# Patient Record
Sex: Male | Born: 1945 | Race: Black or African American | Hispanic: No | Marital: Single | Smoking: Current every day smoker
Health system: Southern US, Community
[De-identification: ages and names within clinical notes are randomized; demographics above are authoritative.]

## PROBLEM LIST (undated history)

## (undated) ENCOUNTER — Ambulatory Visit (HOSPITAL_COMMUNITY): Admission: EM | Payer: Self-pay | Source: Home / Self Care

---

## 2020-03-06 ENCOUNTER — Encounter (HOSPITAL_COMMUNITY): Payer: Self-pay | Admitting: Emergency Medicine

## 2020-03-06 ENCOUNTER — Other Ambulatory Visit: Payer: Self-pay

## 2020-03-06 ENCOUNTER — Emergency Department (HOSPITAL_COMMUNITY): Payer: Self-pay

## 2020-03-06 ENCOUNTER — Emergency Department (HOSPITAL_COMMUNITY)
Admission: EM | Admit: 2020-03-06 | Discharge: 2020-03-06 | Disposition: A | Discharge status: Home / Self Care | Payer: Self-pay | Attending: Emergency Medicine | Admitting: Emergency Medicine

## 2020-03-06 DIAGNOSIS — I10 Essential (primary) hypertension: Secondary | ICD-10-CM | POA: Insufficient documentation

## 2020-03-06 DIAGNOSIS — R42 Dizziness and giddiness: Secondary | ICD-10-CM

## 2020-03-06 LAB — URINALYSIS, ROUTINE W REFLEX MICROSCOPIC
Bacteria, UA: NONE SEEN
Bilirubin Urine: NEGATIVE
Glucose, UA: NEGATIVE mg/dL
Ketones, ur: NEGATIVE mg/dL
Leukocytes,Ua: NEGATIVE
Nitrite: NEGATIVE
Protein, ur: NEGATIVE mg/dL
Specific Gravity, Urine: 1.026 (ref 1.005–1.030)
pH: 5 (ref 5.0–8.0)

## 2020-03-06 LAB — BASIC METABOLIC PANEL
Anion gap: 10 (ref 5–15)
BUN: 10 mg/dL (ref 8–23)
CO2: 27 mmol/L (ref 22–32)
Calcium: 8.6 mg/dL — ABNORMAL LOW (ref 8.9–10.3)
Chloride: 99 mmol/L (ref 98–111)
Creatinine, Ser: 0.95 mg/dL (ref 0.61–1.24)
GFR calc Af Amer: >60 mL/min (ref >60)
GFR calc non Af Amer: >60 mL/min (ref >60)
Glucose, Bld: 100 mg/dL — ABNORMAL HIGH (ref 70–99)
Potassium: 4.2 mmol/L (ref 3.5–5.1)
Sodium: 136 mmol/L (ref 135–145)

## 2020-03-06 LAB — CBC
HCT: 45.2 % (ref 39.0–52.0)
Hemoglobin: 14 g/dL (ref 13.0–17.0)
MCH: 27.6 pg (ref 26.0–34.0)
MCHC: 31 g/dL (ref 30.0–36.0)
MCV: 89.2 fL (ref 80.0–100.0)
Platelets: 292 10*3/uL (ref 150–400)
RBC: 5.07 MIL/uL (ref 4.22–5.81)
RDW: 14.4 % (ref 11.5–15.5)
WBC: 6.6 10*3/uL (ref 4.0–10.5)
nRBC: 0 % (ref 0.0–0.2)

## 2020-03-06 MED ORDER — HYDROCHLOROTHIAZIDE 25 MG PO TABS: 25.0000 mg | ORAL_TABLET | Freq: Every day | ORAL | 0 refills | Status: DC

## 2020-03-06 MED ORDER — SODIUM CHLORIDE 0.9% FLUSH: 3.0000 mL | Freq: Once | INTRAVENOUS | Status: DC

## 2020-03-06 NOTE — ED Provider Notes (Signed)
Emergency Department Provider Note   I have reviewed the triage vital signs and the nursing notes.   HISTORY  Chief Complaint Dizziness   HPI Hudsen Fei is a 74 y.o. male with no known past medical history with lack of primary care doctor, presents to the emergency department with episode of dizziness approximately 1 week ago.  He states that while raking leaves he "felt drunk" and off balance for around 30 minutes.  He denies any associated chest pain, heart palpitations, shortness of breath.  No ear ringing or pain.  He denies any weakness or numbness.  He did not fall to the ground.  His symptoms resolved and have not returned since.  He states he presents today because he has been speaking with his mom who advised that he come up and be seen regarding this.  He does not have a primary care doctor and does not take any home medications.  He has no known past medical history.  He has not had return of this off balance/drunk feeling.   History reviewed. No pertinent past medical history.  There are no problems to display for this patient.   History reviewed. No pertinent surgical history.  Allergies Patient has no known allergies.  No family history on file.  Social History Social History   Tobacco Use  . Smoking status: Never Smoker  . Smokeless tobacco: Never Used  Substance Use Topics  . Alcohol use: Not Currently  . Drug use: Not Currently    Review of Systems  Constitutional: No fever/chills. 30 min episode of off balance feeling 1 week prior.  Eyes: No visual changes. ENT: No sore throat. Cardiovascular: Denies chest pain. Respiratory: Denies shortness of breath. Gastrointestinal: No abdominal pain.  No nausea, no vomiting.  No diarrhea.  No constipation. Genitourinary: Negative for dysuria. Musculoskeletal: Negative for back pain. Skin: Negative for rash. Neurological: Negative for headaches, focal weakness or numbness.   10-point ROS otherwise  negative.  ____________________________________________   PHYSICAL EXAM:  VITAL SIGNS: ED Triage Vitals  Enc Vitals Group     BP 03/06/20 1619 (!) 207/104     Pulse Rate 03/06/20 1619 77     Resp 03/06/20 1619 16     Temp 03/06/20 1619 98.3 F (36.8 C)     Temp Source 03/06/20 1619 Oral     SpO2 03/06/20 1619 99 %     Weight 03/06/20 1621 130 lb (59 kg)     Height 03/06/20 1621 5\' 7"  (1.702 m)   Constitutional: Alert and oriented. Well appearing and in no acute distress. Eyes: Conjunctivae are normal. PERRL. EOMI. Head: Atraumatic. Nose: No congestion/rhinnorhea. Mouth/Throat: Mucous membranes are moist. Neck: No stridor.  Cardiovascular: Normal rate, regular rhythm. Good peripheral circulation. Grossly normal heart sounds.   Respiratory: Normal respiratory effort.  No retractions. Lungs CTAB. Gastrointestinal: Soft and nontender. No distention.  Musculoskeletal: No lower extremity tenderness nor edema. No gross deformities of extremities. Neurologic:  Normal speech and language. No gross focal neurologic deficits are appreciated. Normal CN exam 2-12. Normal finger-to-nose testing. Normal gait.  Skin:  Skin is warm, dry and intact. No rash noted.   ____________________________________________   LABS (all labs ordered are listed, but only abnormal results are displayed)  Labs Reviewed  BASIC METABOLIC PANEL - Abnormal; Notable for the following components:      Result Value   Glucose, Bld 100 (*)    Calcium 8.6 (*)    All other components within normal limits  URINALYSIS,  ROUTINE W REFLEX MICROSCOPIC - Abnormal; Notable for the following components:   Hgb urine dipstick SMALL (*)    All other components within normal limits  CBC  CBG MONITORING, ED   ____________________________________________  EKG   EKG Interpretation  Date/Time:  Thursday March 06 2020 16:40:37 EDT Ventricular Rate:  74 PR Interval:  160 QRS Duration: 70 QT Interval:  396 QTC  Calculation: 439 R Axis:   12 Text Interpretation: Normal sinus rhythm Normal ECG No STEMI Confirmed by Alona Bene [phone]) on 03/06/2020 6:08:55 PM       ____________________________________________  RADIOLOGY  MR BRAIN WO CONTRAST  Result Date: 03/06/2020 CLINICAL DATA:  Dizziness EXAM: MRI HEAD WITHOUT CONTRAST TECHNIQUE: Multiplanar, multiecho pulse sequences of the brain and surrounding structures were obtained without intravenous contrast. COMPARISON:  None. FINDINGS: Brain: There is no acute infarct, acute hemorrhage or extra-axial collection. Early confluent hyperintense T2-weighted signal of the periventricular and deep white matter, most commonly due to chronic ischemic microangiopathy. 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 There is mild generalized volume loss. Bilateral chronic microhemorrhage in the cerebellum. Normal midline structures. Vascular: Normal flow voids Skull and upper cervical spine: Normal marrow signal Sinuses/Orbits: Normal Other: None IMPRESSION: 1. No acute intracranial abnormality. 2. 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 Electronically Signed   By: Deatra Robinson M.D.   On: 03/06/2020 20:08   DG Chest Portable 1 View  Result Date: 03/06/2020 CLINICAL DATA:  Near syncope EXAM: PORTABLE CHEST 1 VIEW COMPARISON:  None. FINDINGS: The cardiac silhouette is mildly enlarged. Aortic calcifications are noted. There is no pneumothorax. No large pleural effusion. No focal infiltrate. There is a rounded density measuring approximately 8 mm projecting over the right lower lung zone at the level of the posterior ninth rib. There is no acute osseous abnormality. IMPRESSION: 1. No acute cardiopulmonary findings. 2. A 8 mm rounded density projecting over the right lower lung zone. This may represent a nipple shadow. A follow-up 4-6 week two-view chest x-ray with nipple markers is recommended for  further evaluation of this finding. Electronically Signed   By: Katherine Mantle M.D.   On: 03/06/2020 18:43    ____________________________________________   PROCEDURES  Procedure(s) performed:   Procedures  None  ____________________________________________   INITIAL IMPRESSION / ASSESSMENT AND PLAN / ED COURSE  Pertinent labs & imaging results that were available during my care of the patient were reviewed by me and considered in my medical decision making (see chart for details).   Patient presents to the emergency department with episode of "feeling drunk" for about 30 minutes 1 week prior.  No chest pain.  EKG shows no acute ischemic changes or arrhythmias.  Patient's initial blood work is largely unremarkable.  He is not having any active symptoms or neuro deficits.  His presenting blood pressure is very high at 207/104.  Unknown past medical history to help further risk stratify.  He does give a description of a process that is possibly central.  Plan for MRI brain along with chest x-ray and follow-up after additional blood work.  Will attempt to facilitate PCP follow up as well.   Labs and imaging reviewed.  I do not find evidence of stroke on MRI.  Patient does have significantly elevated blood pressure but no evidence on exam or history to suspect acute hypertensive emergency.  I will start HCTZ.  I have given him contact information for primary care doctor  and encouraged him to call first thing tomorrow morning to schedule the next available appointment.  Also discussed ED return precautions in detail.  ____________________________________________  FINAL CLINICAL IMPRESSION(S) / ED DIAGNOSES  Final diagnoses:  Dizziness  Essential hypertension     MEDICATIONS GIVEN DURING THIS VISIT:  Medications  sodium chloride flush (NS) 0.9 % injection 3 mL (has no administration in time range)     NEW OUTPATIENT MEDICATIONS STARTED DURING THIS VISIT:  Discharge Medication  List as of 03/06/2020  8:26 PM    START taking these medications   Details  hydrochlorothiazide (HYDRODIURIL) 25 MG tablet Take 1 tablet (25 mg total) by mouth daily., Starting Thu 03/06/2020, Until Sat 04/05/2020, Print        Note:  This document was prepared using Dragon voice recognition software and may include unintentional dictation errors.  Nanda Quinton, MD, [facility] Emergency Medicine    Taelor Waymire, [facility], MD 03/06/20 2252

## 2020-03-06 NOTE — ED Triage Notes (Signed)
Pt states he has been feeling dizzy for 3 days. Denies any weakness, CP, SOB. Grips equal, speech clear. denies any difficulty walking,.  Denies any medical problems. BP 207/104 in triage.

## 2020-03-06 NOTE — ED Notes (Signed)
Patient transported to MRI 

## 2020-03-06 NOTE — ED Notes (Signed)
Signature pad not available for pt. Pt verbalized discharge paperwork and understanding.

## 2020-03-06 NOTE — ED Notes (Signed)
Nani Gasser, to be notified when pt in room at [phone].

## 2020-03-06 NOTE — Discharge Instructions (Signed)
You were seen in the emergency department today with dizziness.  Your work-up today was normal.  You do have elevated blood pressure and need to follow closely with the primary care doctor.  I am starting you on some blood pressure medication here today.  Please take this as prescribed but call the primary care doctor first thing tomorrow morning schedule the next follow-up appointment.

## 2021-05-03 IMAGING — MR MR HEAD W/O CM
12 of 13 series · 44 of 48 positions shown · non-contrast
Comparison: None.

CLINICAL DATA: Dizziness

EXAM:
MRI HEAD WITHOUT CONTRAST
TECHNIQUE: Multiplanar, multiecho pulse sequences of the brain and surrounding
structures were obtained without intravenous contrast.

[Series 5: DWI · axial · 3.0mm · 0.88mm/px · z∈[-78,+80]mm · 8 of 108 slices shown (1 of 4)]
[im 1/108]
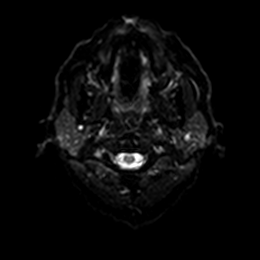
[im 16/108]
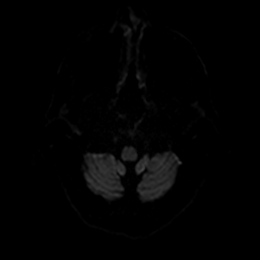
[im 31/108]
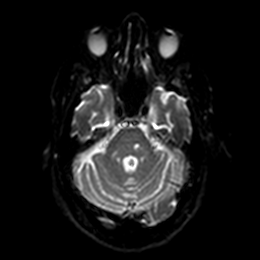
[im 46/108]
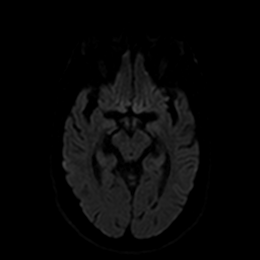
[im 62/108]
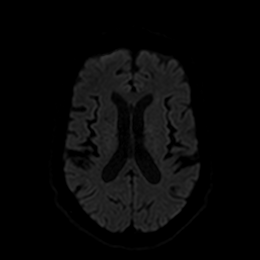
[im 77/108]
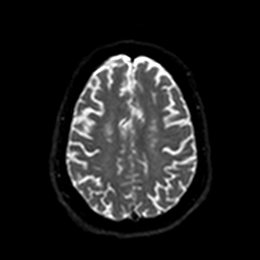
[im 92/108]
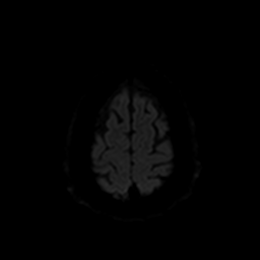
[im 108/108]
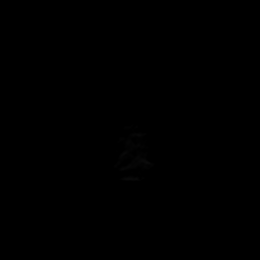

[Series 6: DWI · axial · 3.0mm · 0.88mm/px · z∈[-78,+80]mm · 4 of 54 slices shown (2 of 4)]
[im 1/54]
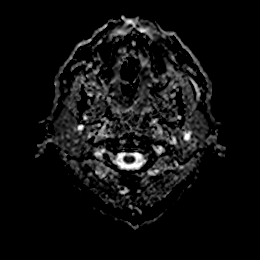
[im 18/54]
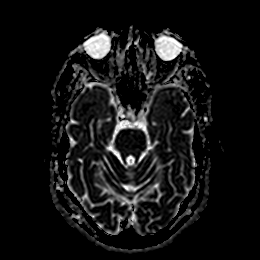
[im 36/54]
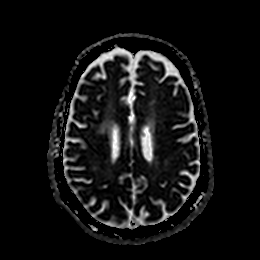
[im 54/54]
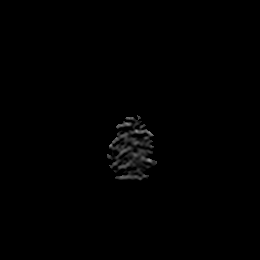

[Series 7: DWI · coronal · 4.0mm · 0.88mm/px · 5 of 76 slices shown (3 of 4)]
[im 1/76]
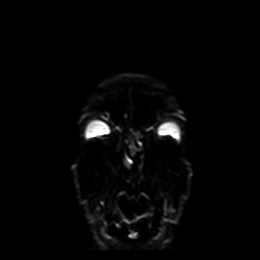
[im 19/76]
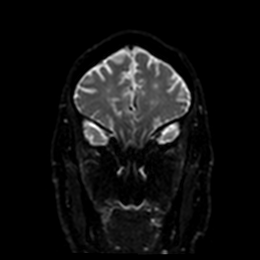
[im 38/76]
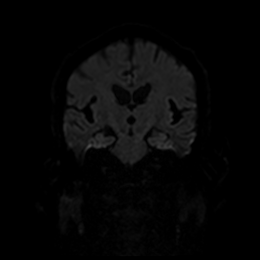
[im 57/76]
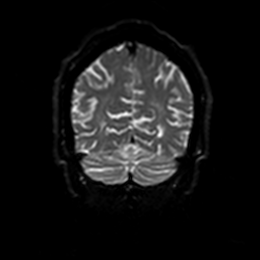
[im 76/76]
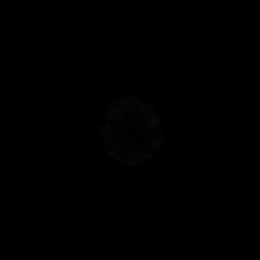

[Series 8: DWI · coronal · 4.0mm · 0.88mm/px · 3 of 38 slices shown (4 of 4)]
[im 1/38]
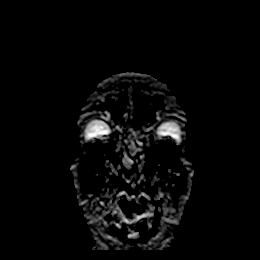
[im 19/38]
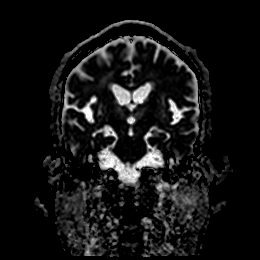
[im 38/38]
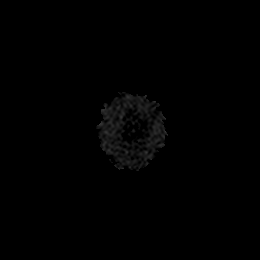

[Series 9: T1 · sagittal · 5.0mm · 0.75mm/px · 2 of 23 slices shown]
[im 1/23]
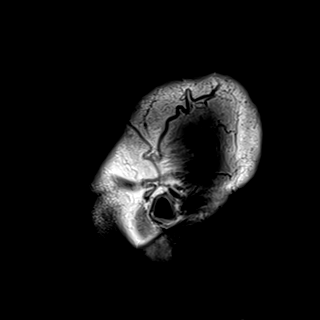
[im 23/23]
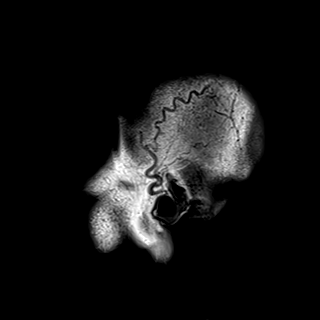

[Series 10: T2 · axial · 5.0mm · 0.72mm/px · z∈[-85,+65]mm · 2 of 26 slices shown (1 of 2)]
[im 1/26]
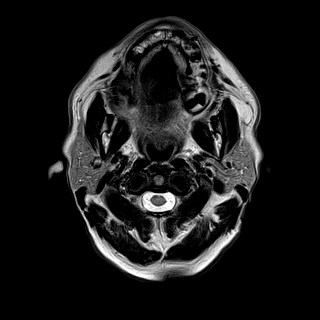
[im 26/26]
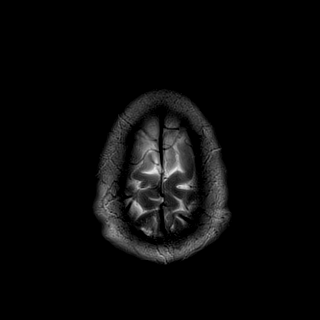

[Series 11: FLAIR · axial · 5.0mm · 0.45mm/px · z∈[-84,+65]mm · 2 of 26 slices shown]
[im 1/26]
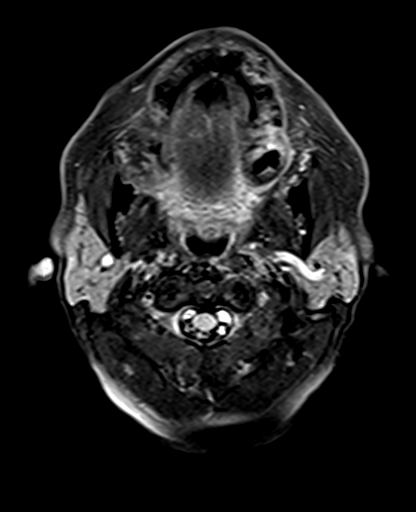
[im 26/26]
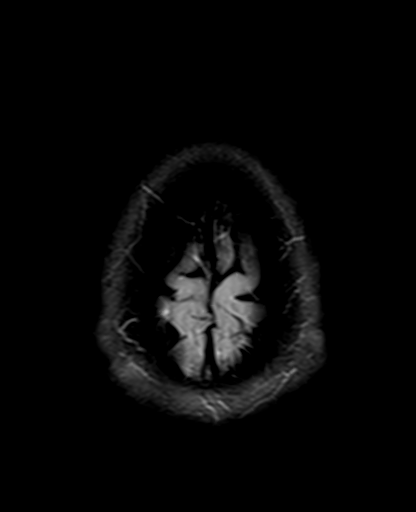

[Series 12: mag_images · axial · 3.0mm · 0.90mm/px · z∈[-87,+89]mm · 4 of 60 slices shown]
[im 1/60]
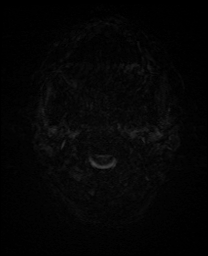
[im 20/60]
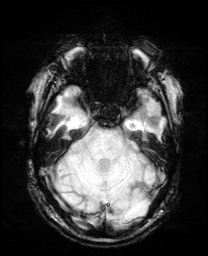
[im 40/60]
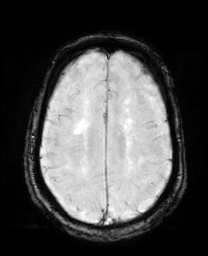
[im 60/60]
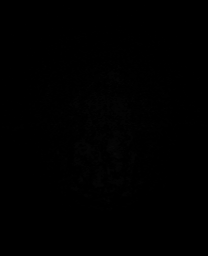

[Series 13: pha_images · axial · 3.0mm · 0.90mm/px · z∈[-87,+87]mm · 4 of 57 slices shown]
[im 1/57]
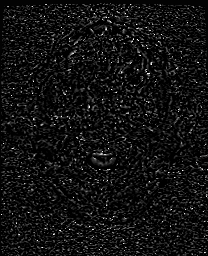
[im 19/57]
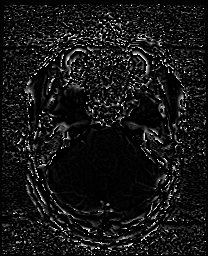
[im 38/57]
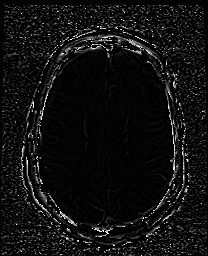
[im 57/57]
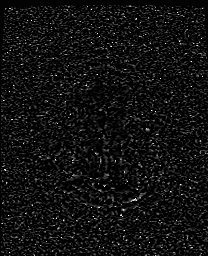

[Series 14: swi_images · axial · 3.0mm · 0.90mm/px · z∈[-87,+89]mm · 4 of 60 slices shown]
[im 1/60]
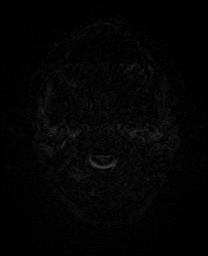
[im 20/60]
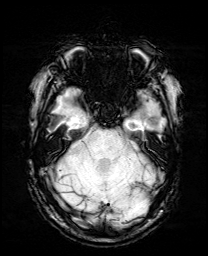
[im 40/60]
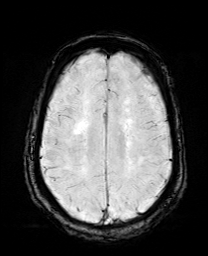
[im 60/60]
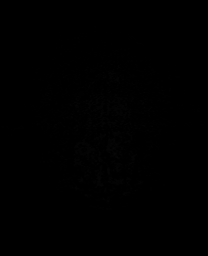

[Series 15: mip_images(sw) · axial · 24.0mm · 0.90mm/px · z∈[-76,+79]mm · 4 of 53 slices shown]
[im 1/53]
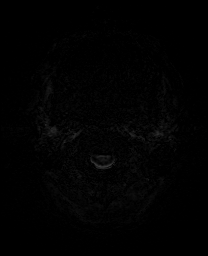
[im 18/53]
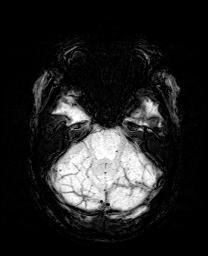
[im 35/53]
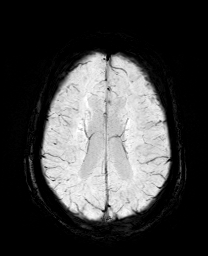
[im 53/53]
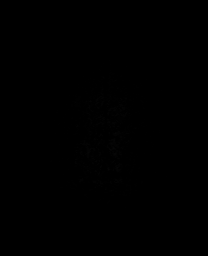

[Series 17: T2 · coronal · 5.0mm · 0.34mm/px · 2 of 29 slices shown (2 of 2)]
[im 1/29]
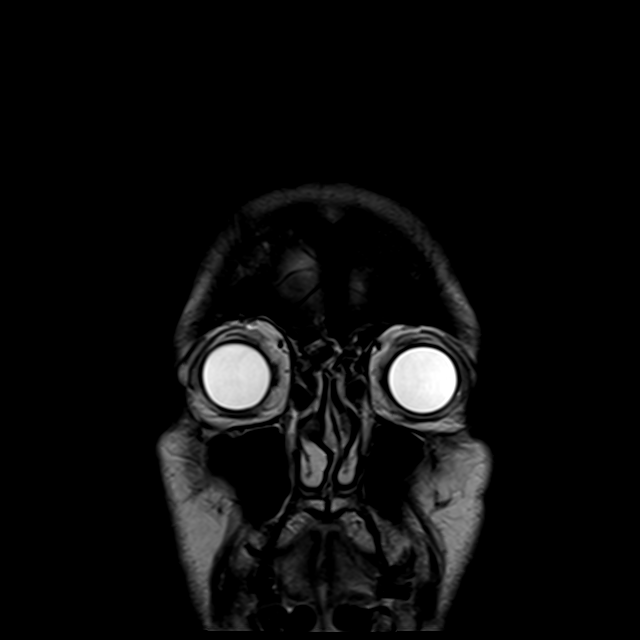
[im 29/29]
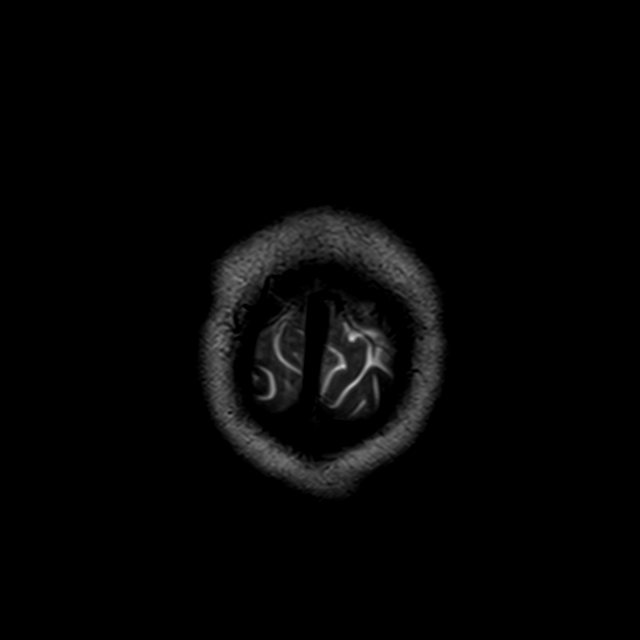

[44 of 48 positions shown; findings below may reference images not displayed]

FINDINGS: Brain: There is no acute infarct, acute hemorrhage or extra-axial
collection. Early confluent hyperintense T2-weighted signal of the
periventricular and deep white matter, most commonly due to chronic
ischemic microangiopathy. There are multiple old small vessel
infarcts of the basal ganglia and corona radiata. There is mild
generalized volume loss. Bilateral chronic microhemorrhage in the
cerebellum. Normal midline structures.

Vascular: Normal flow voids

Skull and upper cervical spine: Normal marrow signal

Sinuses/Orbits: Normal

Other: None
IMPRESSION: 1. No acute intracranial abnormality.
2. Findings of chronic ischemic microangiopathy and mild generalized
volume loss.

## 2021-05-03 IMAGING — DX DG CHEST 1V PORT
1 series · 1 of 1 positions shown · non-contrast
Comparison: None.

CLINICAL DATA: Near syncope

EXAM:
PORTABLE CHEST 1 VIEW

[chest ap]
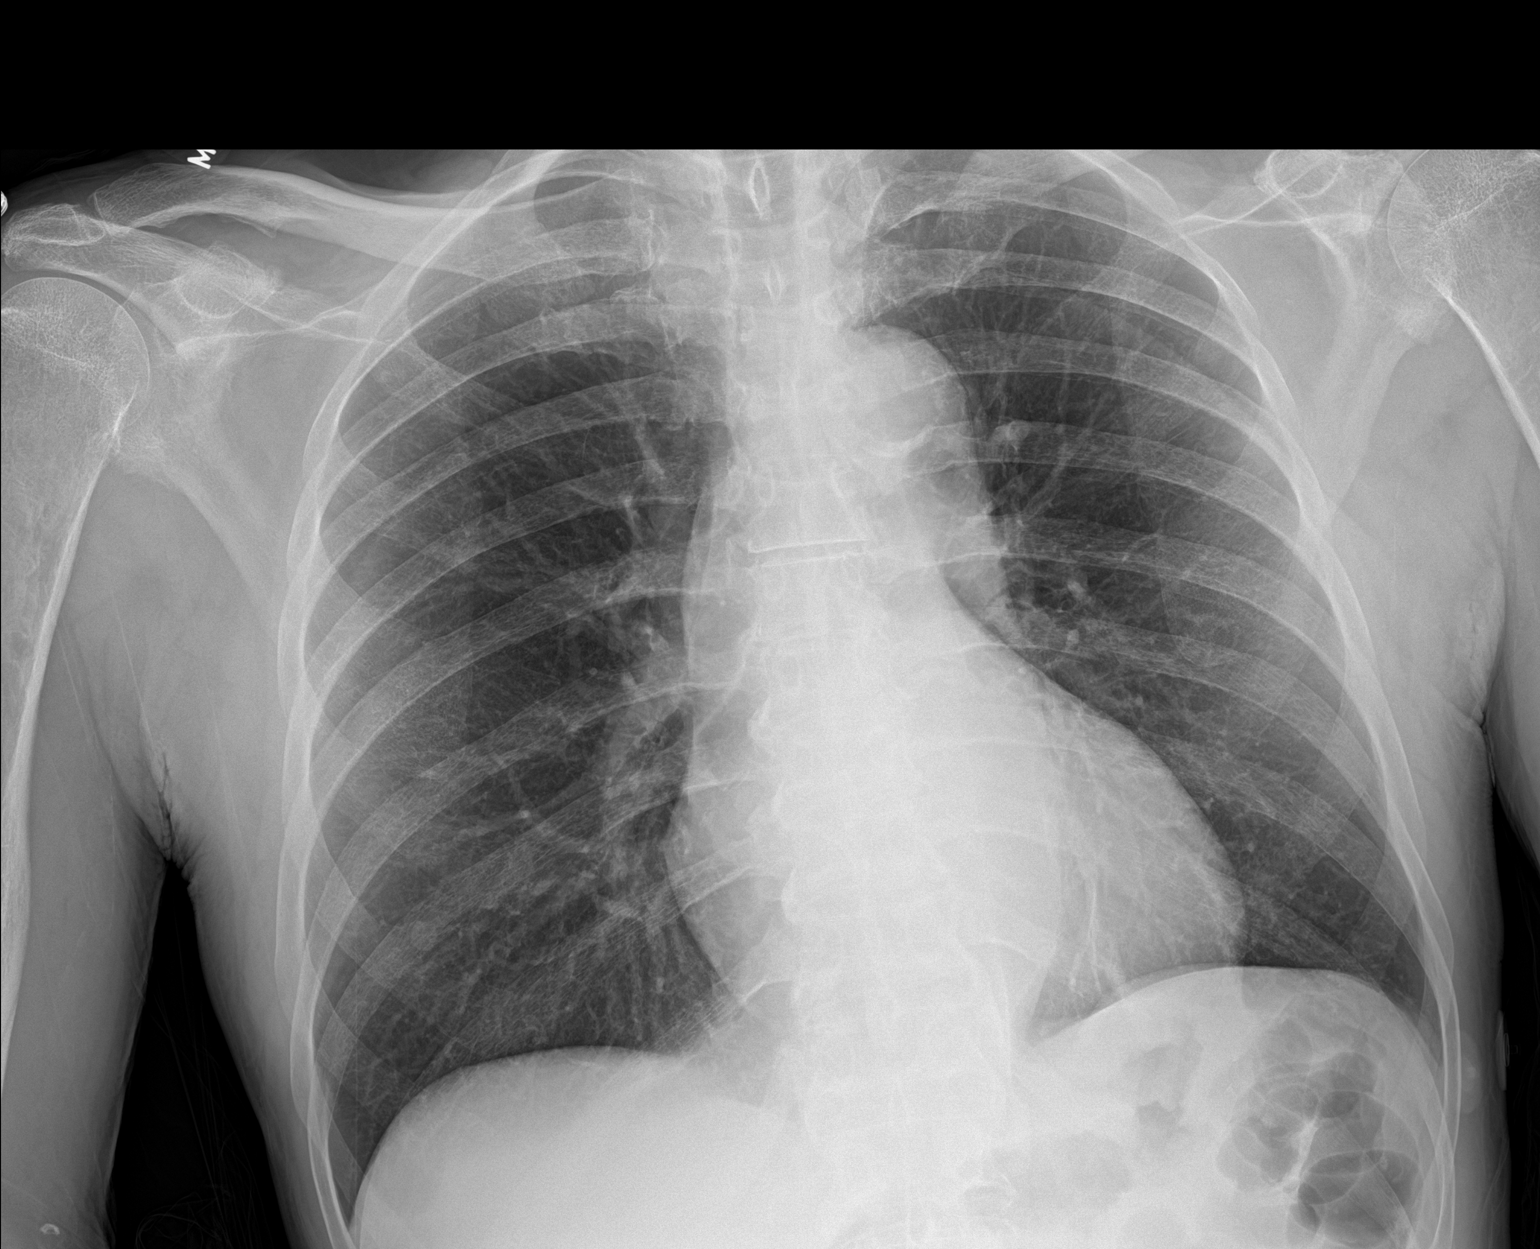

[1 of 1 positions shown; findings below may reference images not displayed]

FINDINGS: The cardiac silhouette is mildly enlarged. Aortic calcifications are
noted. There is no pneumothorax. No large pleural effusion. No focal
infiltrate. There is a rounded density measuring approximately 8 mm
projecting over the right lower lung zone at the level of the
posterior ninth rib. There is no acute osseous abnormality.
IMPRESSION: 1. No acute cardiopulmonary findings.
2. A 8 mm rounded density projecting over the right lower lung zone.
This may represent a nipple shadow. A follow-up 4-6 week two-view
chest x-ray with nipple markers is recommended for further
evaluation of this finding.

## 2024-04-27 ENCOUNTER — Encounter (HOSPITAL_COMMUNITY): Payer: Self-pay

## 2024-04-27 ENCOUNTER — Ambulatory Visit (INDEPENDENT_AMBULATORY_CARE_PROVIDER_SITE_OTHER): Payer: Self-pay

## 2024-04-27 ENCOUNTER — Ambulatory Visit (HOSPITAL_COMMUNITY)
Admission: EM | Admit: 2024-04-27 | Discharge: 2024-04-27 | Disposition: A | Discharge status: Home / Self Care | Payer: Self-pay | Attending: Emergency Medicine | Admitting: Emergency Medicine

## 2024-04-27 DIAGNOSIS — R051 Acute cough: Secondary | ICD-10-CM

## 2024-04-27 DIAGNOSIS — I10 Essential (primary) hypertension: Secondary | ICD-10-CM

## 2024-04-27 DIAGNOSIS — J069 Acute upper respiratory infection, unspecified: Secondary | ICD-10-CM

## 2024-04-27 MED ORDER — AZELASTINE HCL 0.1 % NA SOLN: 2.0000 | Freq: Two times a day (BID) | NASAL | 0 refills | Status: AC

## 2024-04-27 MED ORDER — BENZONATATE 100 MG PO CAPS: 100.0000 mg | ORAL_CAPSULE | Freq: Three times a day (TID) | ORAL | 0 refills | Status: AC

## 2024-04-27 MED ORDER — HYDROCHLOROTHIAZIDE 25 MG PO TABS: 25.0000 mg | ORAL_TABLET | Freq: Every day | ORAL | 1 refills | Status: DC

## 2024-04-27 MED ORDER — HYDROCHLOROTHIAZIDE 25 MG PO TABS: 25.0000 mg | ORAL_TABLET | Freq: Every day | ORAL | 1 refills | Status: AC

## 2024-04-27 MED ORDER — AMOXICILLIN-POT CLAVULANATE 875-125 MG PO TABS: 1.0000 | ORAL_TABLET | Freq: Two times a day (BID) | ORAL | 0 refills | Status: AC

## 2024-04-27 NOTE — ED Provider Notes (Signed)
 [facility]    CSN: [id number] Arrival date & time: 04/27/24  0940      History   Chief Complaint Chief Complaint  Patient presents with   Cough    HPI Andrew Barber is a 78 y.o. male.   Patient presents with son for cough, congestion, and subjective fever that began on 5/1.  Son states that patient initially had mild symptoms and the symptoms have become progressively worse.  Son states that he has been giving Mucinex with some relief.  Son states that patient will get coughing fits and coughs up lots of thick yellow mucus.  Son states that his brother was recently ill with a cold and the patient was around him and drink after him recently and then began to have symptoms.  Denies history of COPD or asthma.  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  Blood pressure is elevated in clinic today.  Son denies any known history of high blood pressure.  Per patient's chart he has been diagnosed with hypertension in the past and started on hydrochlorothiazide .  Son states that he does not take anything for blood pressure daily and does not currently have a primary care doctor.    The history is provided by a relative, the patient and medical records.  Cough   No past medical history on file.  There are no active problems to display for this patient.   No past surgical history on file.     Home Medications    Prior to Admission medications   Medication Sig Start Date End Date Taking? Authorizing Provider  amoxicillin-clavulanate (AUGMENTIN) 875-125 MG tablet Take 1 tablet by mouth every 12 (twelve) hours. 04/27/24  Yes Rosevelt Constable, Novella Abraha A, NP  azelastine (ASTELIN) 0.1 % nasal spray Place 2 sprays into both nostrils 2 (two) times daily. Use in each nostril as directed 04/27/24  Yes Levora Reas A, NP  benzonatate (TESSALON) 100 MG capsule Take 1 capsule (100 mg total) by mouth every 8 (eight) hours. 04/27/24   Yes Rosevelt Constable, Dejane Scheibe A, NP  hydrochlorothiazide  (HYDRODIURIL ) 25 MG tablet Take 1 tablet (25 mg total) by mouth daily. 04/27/24 05/27/24  Karon Packer, NP    Family History No family history on file.  Social History Social History   Tobacco Use   Smoking status: Every Day    Types: Cigarettes   Smokeless tobacco: Never  Vaping Use   Vaping status: Never Used  Substance Use Topics   Alcohol use: Not Currently   Drug use: Never     Allergies   Patient has no known allergies.   Review of Systems Review of Systems  Respiratory:  Positive for cough.    Per HPI  Physical Exam Triage Vital Signs ED Triage Vitals  Encounter Vitals Group     BP 04/27/24 0952 (!) 201/87     Systolic BP Percentile --      Diastolic BP Percentile --      Pulse Rate 04/27/24 0952 82     Resp 04/27/24 0952 18     Temp 04/27/24 0952 98.4 F (36.9 C)     Temp Source 04/27/24 0952 Oral     SpO2 04/27/24 0952 99 %     Weight --      Height --      Head Circumference --      Peak Flow --      Pain Score 04/27/24 0950 0  Pain Loc --      Pain Education --      Exclude from Growth Chart --    No data found.  Updated Vital Signs BP (!) 201/87 (BP Location: Right Arm)   Pulse 82   Temp 98.4 F (36.9 C) (Oral)   Resp 18   SpO2 99%   Visual Acuity Right Eye Distance:   Left Eye Distance:   Bilateral Distance:    Right Eye Near:   Left Eye Near:    Bilateral Near:     Physical Exam Vitals and nursing note reviewed.  Constitutional:      General: He is awake. He is not in acute distress.    Appearance: Normal appearance. He is well-developed and well-groomed. He is not ill-appearing.  HENT:     Right Ear: Tympanic membrane, ear canal and external ear normal.     Left Ear: Tympanic membrane, ear canal and external ear normal.     Nose: Congestion and rhinorrhea present.     Mouth/Throat:     Mouth: Mucous membranes are moist.     Pharynx: Posterior oropharyngeal erythema  present. No oropharyngeal exudate.  Cardiovascular:     Rate and Rhythm: Normal rate and regular rhythm.  Pulmonary:     Effort: Pulmonary effort is normal.     Breath sounds: Normal breath sounds.  Skin:    General: Skin is warm and dry.  Neurological:     Mental Status: He is alert.  Psychiatric:        Behavior: Behavior is cooperative.      UC Treatments / Results  Labs (all labs ordered are listed, but only abnormal results are displayed) Labs Reviewed - No data to display  EKG   Radiology DG Chest 2 View Result Date: 04/27/2024 CLINICAL DATA:  Productive cough for 9 days. EXAM: CHEST - 2 VIEW COMPARISON:  March 06, 2020. FINDINGS: The heart size and mediastinal contours are within normal limits. Both lungs are clear. The visualized skeletal structures are unremarkable. IMPRESSION: No active cardiopulmonary disease. Electronically Signed   By: Rosalene Colon M.D.   On: 04/27/2024 12:15    Procedures Procedures (including critical care time)  Medications Ordered in UC Medications - No data to display  Initial Impression / Assessment and Plan / UC Course  I have reviewed the triage vital signs and the nursing notes.  Pertinent labs & imaging results that were available during my care of the patient were reviewed by me and considered in my medical decision making (see chart for details).     Patient is well appearing.  Vitals are stable.  Blood pressure is elevated at 201/87.  Congestion and rhinorrhea noted on exam.  Mild erythema noted to pharynx.  Lungs clear bilaterally to auscultation.  Patient began to have coughing fit during exam and coughed up thick yellow to green sputum.  Chest x-ray ordered.  Based on my interpretation there is no obvious pneumonia or active cardiopulmonary disease on x-ray.  Radiology report confirms this.  Prescribed Augmentin for upper respiratory infection.  Prescribed Tessalon as needed for cough.  Prescribed azelastine nasal spray for  congestion.  Recommended continuing Mucinex for cough and congestion.  Restarted patient on hydrochlorothiazide  as previously prescribed for his blood pressure.  Given [facility] to get established with a primary care.  Discussed return precautions Final Clinical Impressions(s) / UC Diagnoses   Final diagnoses:  Acute cough  Upper respiratory tract infection, unspecified  type  Essential hypertension     Discharge Instructions      Start giving him Augmentin twice daily for 7 days for upper respiratory infection. He can take Tessalon every 8 hours as needed for cough. He can use azelastine nasal spray twice daily to help with congestion. He can continue to take Mucinex as needed for cough and congestion.  I have restarted his hydrochlorothiazide .  Have him take this once daily for his blood pressure.  I have given him a 30-day supply with 1 refill. Follow-up with [facility] for further evaluation and management of his blood pressure.  Return here as needed.   ED Prescriptions     Medication Sig Dispense Auth. Provider   hydrochlorothiazide  (HYDRODIURIL ) 25 MG tablet  (Status: Discontinued) Take 1 tablet (25 mg total) by mouth daily. 30 tablet Levora Reas A, NP   amoxicillin-clavulanate (AUGMENTIN) 875-125 MG tablet Take 1 tablet by mouth every 12 (twelve) hours. 14 tablet Rosevelt Constable, Wilberto Console A, NP   benzonatate (TESSALON) 100 MG capsule Take 1 capsule (100 mg total) by mouth every 8 (eight) hours. 21 capsule Rosevelt Constable, Ricky Gallery A, NP   azelastine (ASTELIN) 0.1 % nasal spray Place 2 sprays into both nostrils 2 (two) times daily. Use in each nostril as directed 30 mL Levora Reas A, NP   hydrochlorothiazide  (HYDRODIURIL ) 25 MG tablet Take 1 tablet (25 mg total) by mouth daily. 30 tablet Levora Reas A, NP      PDMP not reviewed this encounter.   Levora Reas A, NP 04/27/24 [phone]

## 2024-04-27 NOTE — ED Triage Notes (Signed)
 Chief Complaint: cough, runny nose, fever.   Sick exposure: A few family members sick as well  Onset: 7 days ago worse again 3 days ago  Prescriptions or OTC medications tried: Yes- Mucinex   with moderate relief  New foods, medications, or products: No  Recent Travel: No

## 2024-04-27 NOTE — Discharge Instructions (Signed)
 Start giving him Augmentin twice daily for 7 days for upper respiratory infection. He can take Tessalon every 8 hours as needed for cough. He can use azelastine nasal spray twice daily to help with congestion. He can continue to take Mucinex as needed for cough and congestion.  I have restarted his hydrochlorothiazide .  Have him take this once daily for his blood pressure.  I have given him a 30-day supply with 1 refill. Follow-up with [facility] for further evaluation and management of his blood pressure.  Return here as needed.

## 2024-07-02 ENCOUNTER — Telehealth: Payer: Self-pay

## 2024-07-02 NOTE — Telephone Encounter (Signed)
 Mr. Beutler is not a patient of our office and does not have an appointment scheduled. Called and spoke with Eva at [facility] and advised that Mr. Dayhoff is not our patient. Mjp,lpn  Copied from CRM [id number]. Topic: Clinical - Medical Advice >> Jul 02, 2024  9:27 AM Gustabo BIRCH wrote: Vila calling from [employer] has questions about the patient's health- call back at [phone]  [id number]- Reference number

## 2024-07-16 ENCOUNTER — Telehealth: Payer: Self-pay

## 2024-07-16 NOTE — Telephone Encounter (Signed)
 Copied from CRM [id number]. Topic: General - Other >> Jul 16, 2024 11:12 AM Willma SAUNDERS wrote: Reason for CRM: Garrel form [facility] is requesting to speak with the clinical staff to get information of the patietns diagnosis. Reference ID [id number]  Garrel can be reached at [phone]
